# Patient Record
Sex: Female | Born: 1985 | Race: White | Hispanic: No | Marital: Single | Smoking: Never smoker
Health system: Southern US, Community
[De-identification: ages and names within clinical notes are randomized; demographics above are authoritative.]

## PROBLEM LIST (undated history)

## (undated) DIAGNOSIS — I82409 Acute embolism and thrombosis of unspecified deep veins of unspecified lower extremity: Secondary | ICD-10-CM

---

## 2006-11-04 ENCOUNTER — Ambulatory Visit: Payer: Self-pay | Admitting: Family Medicine

## 2006-11-04 LAB — CONVERTED CEMR LAB
EBV VCA IgG: 5.63 — ABNORMAL HIGH
EBV VCA IgM: 0.1
Eosinophils Absolute: 0.1 10*3/uL (ref 0.0–0.6)
Lymphocytes Relative: 27.8 % (ref 12.0–46.0)
MCHC: 34.3 g/dL (ref 30.0–36.0)
MCV: 87.8 fL (ref 78.0–100.0)
Monocytes Absolute: 0.6 10*3/uL (ref 0.2–0.7)
Monocytes Relative: 8 % (ref 3.0–11.0)
Neutro Abs: 4.4 10*3/uL (ref 1.4–7.7)
Platelets: 257 10*3/uL (ref 150–400)

## 2007-04-20 ENCOUNTER — Emergency Department (HOSPITAL_COMMUNITY): Admission: EM | Admit: 2007-04-20 | Discharge: 2007-04-20 | Payer: Self-pay | Admitting: Emergency Medicine

## 2007-06-12 DIAGNOSIS — R51 Headache: Secondary | ICD-10-CM | POA: Insufficient documentation

## 2007-06-12 DIAGNOSIS — R519 Headache, unspecified: Secondary | ICD-10-CM | POA: Insufficient documentation

## 2007-09-21 ENCOUNTER — Emergency Department (HOSPITAL_COMMUNITY): Admission: EM | Admit: 2007-09-21 | Discharge: 2007-09-22 | Payer: Self-pay | Admitting: Emergency Medicine

## 2007-10-03 ENCOUNTER — Ambulatory Visit: Payer: Self-pay | Admitting: Family Medicine

## 2011-01-15 NOTE — Assessment & Plan Note (Signed)
[facility] OFFICE NOTE   Sarah Elliott, Sarah Elliott                  MRN:          [id number]  DATE:11/04/2006                            DOB:          Jun 19, 1986    This is a 25 year old woman, here to establish with our practice,  complaining of a sore throat and swollen tonsils.  About three months  ago, she began having problems with intermittent swelling of her  tonsils, a sore throat and a low-grade fever.  She denies any headache  or sinus symptoms.  There is no cough associated with it.  She takes  Tylenol and drinks fluids.  For the first week or so when it started,  she felt the worst she has felt through this episode.  Now, her recent  flare-ups have been much milder.  She went to urgent care recently and  had a negative throat culture performed.  She was put on some type of  what sounds like a cephalosporin, as well as a course of steroids, and  she felt a bit better briefly, but never felt well.  Now she is still  struggling with the same symptoms.   PAST MEDICAL HISTORY:  She has not had a primary care physician in some  time, relying instead on urgent care visits occasionally.  She is G0,  P0.  She has never had a gynecological exam.  She has never had a  surgery.  She does have migraine headaches, but they are infrequent and  managed with over-the-counter agents only.   ALLERGIES:  PENICILLIN causes a rash.   CURRENT MEDICATIONS:  None.   HABITS:  She drinks some alcohol, but does not use tobacco.   SOCIAL HISTORY:  She is single.  She works at a [employer].   FAMILY HISTORY:  Remarkable for hypertension, diabetes, breast cancer  and migraine headaches.   OBJECTIVE:  Height 5 feet 7 inches, weight 197, BP 112/76, pulse 64 and  regular, temperature 99.9 degrees.  IN GENERAL:  She is in no acute distress.  EYES:  Clear.  EARS:  Clear.  OROPHARYNX:  Shows no erythema at all.  There  is no exudate.  Her  tonsils are somewhat large, with the right side being larger than the  left.  NECK:  Supple without lymphadenopathy at all.  LUNGS:  Clear.   ASSESSMENT AND PLAN:  Probable mononucleosis.  I suggested switching  from Tylenol to Aleve.  She is to take 2 tablets twice a day until she  feels better.  She will continue with fluids.  We will draw a CBC with differential and titers for Epstein-Barr virus  today.  I told her there is no  specific treatment for this entity, although she should be feeling  better sometime in the near future.     Tera Mater. Clent Ridges, MD  Electronically Signed    SAF/MedQ  DD: 11/04/2006  DT: 11/04/2006  Job #: [id number]

## 2011-05-21 LAB — DIFFERENTIAL
Basophils Absolute: 0.1
Basophils Relative: 1
Eosinophils Absolute: 0.1
Monocytes Absolute: 0.5
Monocytes Relative: 4
Neutro Abs: 7.4
Neutrophils Relative %: 69

## 2011-05-21 LAB — CBC
Hemoglobin: 14.4
MCHC: 34.1
RBC: 4.7
RDW: 12.6

## 2011-05-21 LAB — COMPREHENSIVE METABOLIC PANEL
ALT: 13
Alkaline Phosphatase: 52
BUN: 13
Chloride: 105
Glucose, Bld: 91
Potassium: 3.5
Sodium: 138
Total Bilirubin: 0.7
Total Protein: 7.2

## 2011-05-21 LAB — URINALYSIS, ROUTINE W REFLEX MICROSCOPIC
Bilirubin Urine: NEGATIVE
Glucose, UA: NEGATIVE
Ketones, ur: NEGATIVE
Specific Gravity, Urine: 1.025
pH: 6.5

## 2011-05-21 LAB — URINE MICROSCOPIC-ADD ON

## 2018-03-06 ENCOUNTER — Emergency Department
Admission: EM | Admit: 2018-03-06 | Discharge: 2018-03-06 | Disposition: A | Discharge status: Home / Self Care | Payer: BLUE CROSS/BLUE SHIELD | Attending: Student in an Organized Health Care Education/Training Program | Admitting: Student in an Organized Health Care Education/Training Program

## 2018-03-06 ENCOUNTER — Encounter: Payer: Self-pay | Admitting: *Deleted

## 2018-03-06 ENCOUNTER — Other Ambulatory Visit: Payer: Self-pay

## 2018-03-06 ENCOUNTER — Emergency Department: Payer: BLUE CROSS/BLUE SHIELD

## 2018-03-06 DIAGNOSIS — M79661 Pain in right lower leg: Secondary | ICD-10-CM | POA: Insufficient documentation

## 2018-03-06 DIAGNOSIS — M79604 Pain in right leg: Secondary | ICD-10-CM

## 2018-03-06 DIAGNOSIS — O9989 Other specified diseases and conditions complicating pregnancy, childbirth and the puerperium: Secondary | ICD-10-CM | POA: Insufficient documentation

## 2018-03-06 DIAGNOSIS — Z3A21 21 weeks gestation of pregnancy: Secondary | ICD-10-CM

## 2018-03-06 DIAGNOSIS — Z86718 Personal history of other venous thrombosis and embolism: Secondary | ICD-10-CM | POA: Insufficient documentation

## 2018-03-06 HISTORY — DX: Acute embolism and thrombosis of unspecified deep veins of unspecified lower extremity: I82.409

## 2018-03-06 NOTE — ED Provider Notes (Signed)
[facility] Emergency Department Provider Note    First MD Initiated Contact with Patient 03/06/18 1920     (approximate)  I have reviewed the triage vital signs and the nursing notes.   HISTORY  Chief Complaint Leg Pain    HPI Sarah Elliott is a 32 y.o. 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  Did recently fly back from [place].  Has not noted any significant swelling.  She spoke with her OB/GYN who recommended she come to the ER for evaluation rule out DVT since she did have one previously.  Previous when related to birth control.  Denies any weakness.  No abdominal pain.  No chest pain or shortness of breath.  Is still feeling the baby kick.  No vaginal bleeding or pelvic discomfort.  Past Medical History:  Diagnosis Date  . DVT, lower extremity (HCC)    History reviewed. No pertinent family history. History reviewed. No pertinent surgical history. Patient Active Problem List   Diagnosis Date Noted  . HEADACHE 06/12/2007      Prior to Admission medications   Not on File    Allergies Amoxicillin and Penicillins    Social History Social History   Tobacco Use  . Smoking status: Never Smoker  . Smokeless tobacco: Never Used  Substance Use Topics  . Alcohol use: Never    Frequency: Never  . Drug use: Never    Review of Systems Patient denies headaches, rhinorrhea, blurry vision, numbness, shortness of breath, chest pain, edema, cough, abdominal pain, nausea, vomiting, diarrhea, dysuria, fevers, rashes or hallucinations unless otherwise stated above in HPI. ____________________________________________   PHYSICAL EXAM:  VITAL SIGNS: Vitals:   03/06/18 1700  BP: 124/65  Pulse: 67  Resp: 16  Temp: 98.4 F (36.9 C)  SpO2: 100%    Constitutional: Alert and oriented. Well appearing and in no acute  distress. Eyes: Conjunctivae are normal.  Head: Atraumatic. Nose: No congestion/rhinnorhea. Mouth/Throat: Mucous membranes are moist.   Neck: Painless ROM.  Cardiovascular:   Good peripheral circulation. Respiratory: Normal respiratory effort.  No retractions.  Gastrointestinal: gravid Soft and nontender.  Musculoskeletal: No lower extremity tenderness .  No joint effusions. Neurologic:  Normal speech and language. No gross focal neurologic deficits are appreciated.  Skin:  Skin is warm, dry and intact. No rash noted. Psychiatric: Mood and affect are normal. Speech and behavior are normal.  ____________________________________________   LABS (all labs ordered are listed, but only abnormal results are displayed)  No results found for this or any previous visit (from the past 24 hour(s)). ____________________________________________ ____________________________________________  RADIOLOGY  I personally reviewed all radiographic images ordered to evaluate for the above acute complaints and reviewed radiology reports and findings.  These findings were personally discussed with the patient.  Please see medical record for radiology report.  ____________________________________________   PROCEDURES  Procedure(s) performed:  Procedures    Critical Care performed: no ____________________________________________   INITIAL IMPRESSION / ASSESSMENT AND PLAN / ED COURSE  Pertinent labs & imaging results that were available during my care of the patient were reviewed by me and considered in my medical decision making (see chart for details).  DDX: DVT, edema, cellulitis Calynn Ferrero is a 32 y.o. who presents to the ED with symptoms as described above.  Ultrasound shows no evidence of DVT.  Patient well-appearing and otherwise asymptomatic.  No signs of infectious process.  Discussed follow-up with PCP.  Have discussed with the patient and available family all diagnostics and  treatments performed thus far and all questions were answered to the best of my ability. The patient demonstrates understanding and agreement with plan.       ____________________________________________   FINAL CLINICAL IMPRESSION(S) / ED DIAGNOSES  Final diagnoses:  Right leg pain  [redacted] weeks gestation of pregnancy      NEW MEDICATIONS STARTED DURING THIS VISIT:  New Prescriptions   No medications on file     Note:  This document was prepared using Dragon voice recognition software and may include unintentional dictation errors.     Willy Eddyobinson, Arieona Swaggerty, MD 03/06/18 [phone]

## 2018-03-06 NOTE — ED Triage Notes (Addendum)
PT to ED reporting right leg pain. Pt is currently on 40 mg Lovenox daily due to pregnancy and hx of DVT. No color change pedal pulse intact and equal bilaterally. No numbness reported. No pain behind the right knee. Slight pain increased when RN pushed pts right foot back. [place] from [place] and flew here.

## 2018-03-06 NOTE — Discharge Instructions (Signed)
Follow-up with your OB/GYN and primary.  Return for worsening symptoms including worsening painful swelling, any shortness of breath or chest pain or for any additional questions or concerns.

## 2018-08-15 IMAGING — US US EXTREM LOW VENOUS*R*
1 series · 13 of 24 positions shown · non-contrast
Comparison: None

CLINICAL DATA: RIGHT leg pain



[Series 1: us extrem low venous*right* · 0.07mm/px · 13 of 35 slices shown]
[im 1/35]
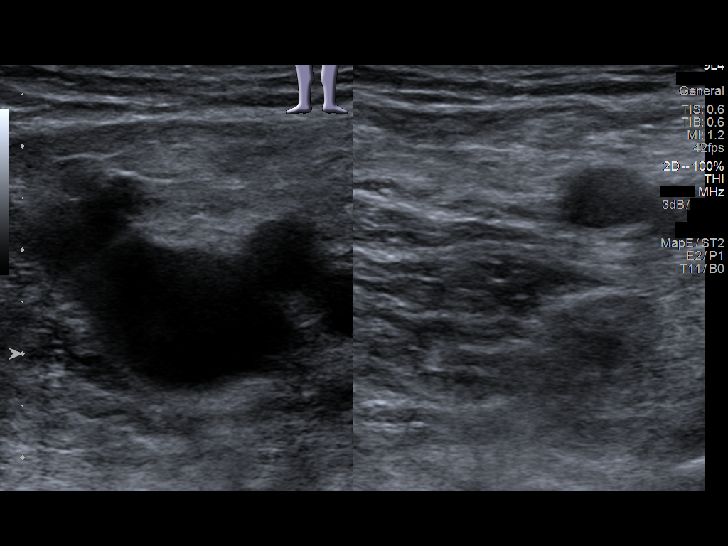
[im 3/35]
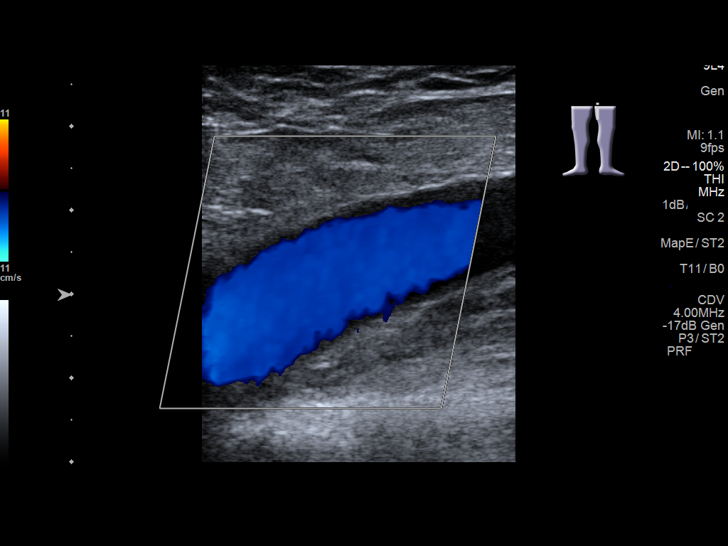
[im 6/35]
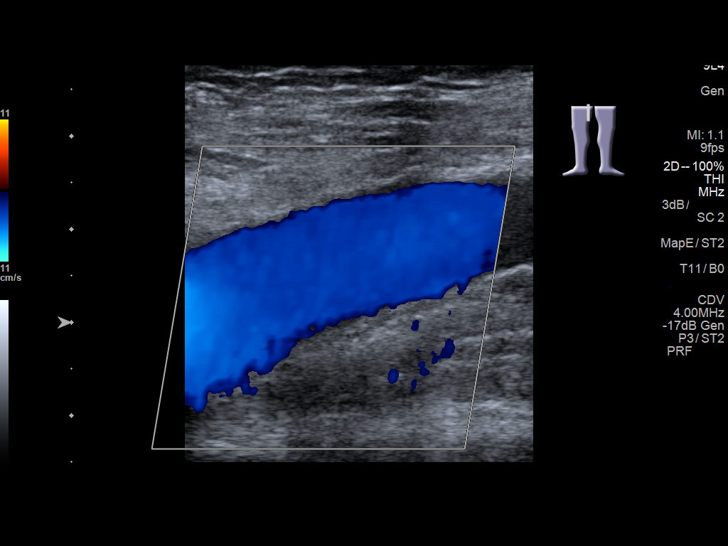
[im 9/35]
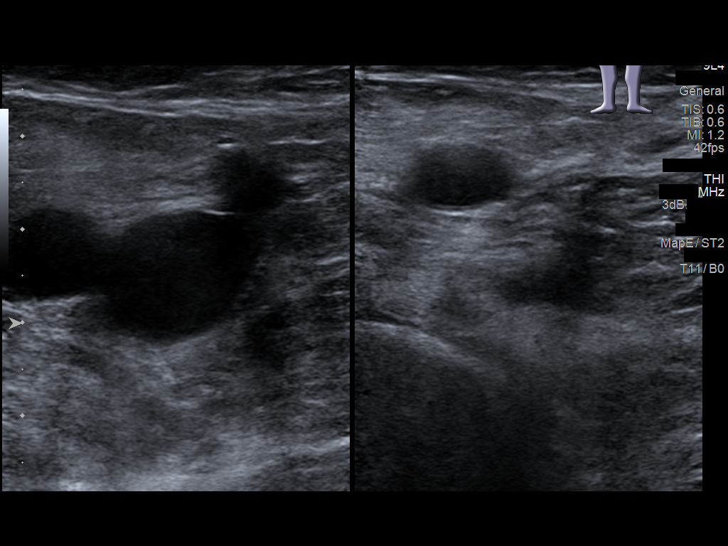
[im 12/35]
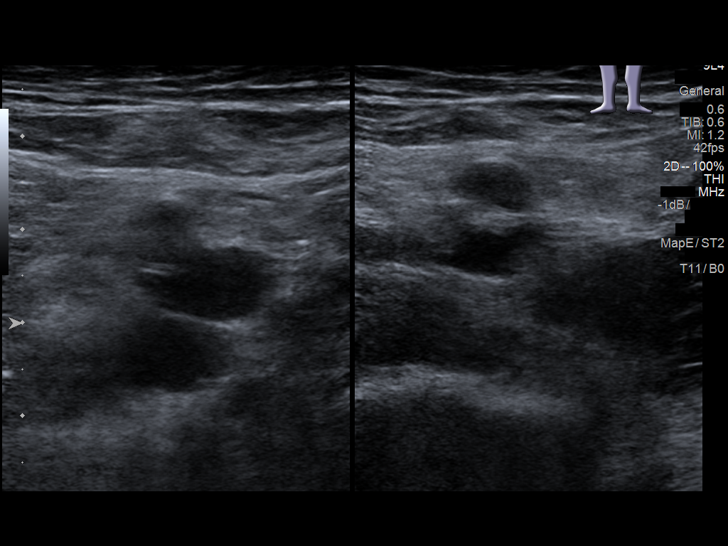
[im 15/35]
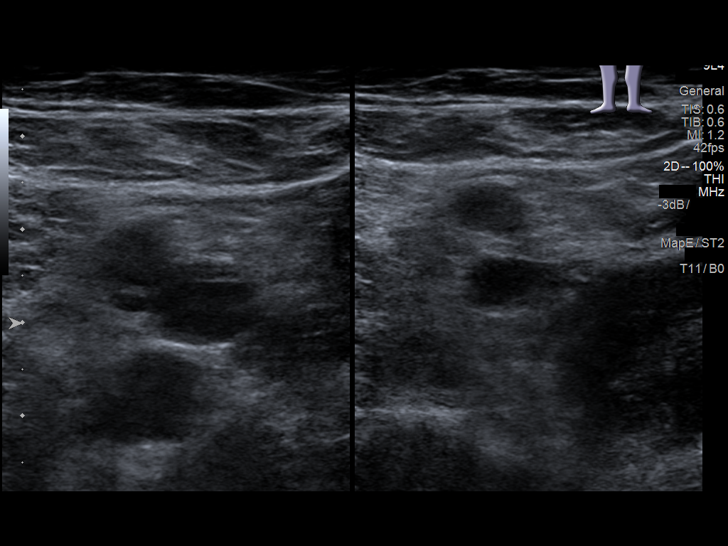
[im 18/35]
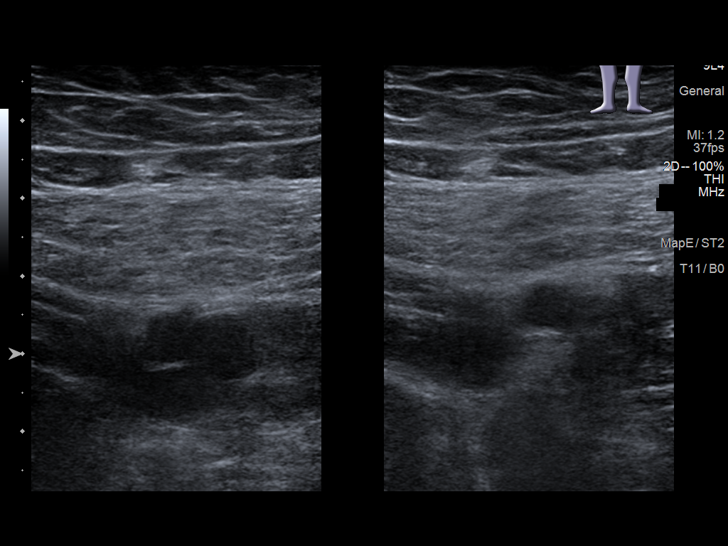
[im 20/35]
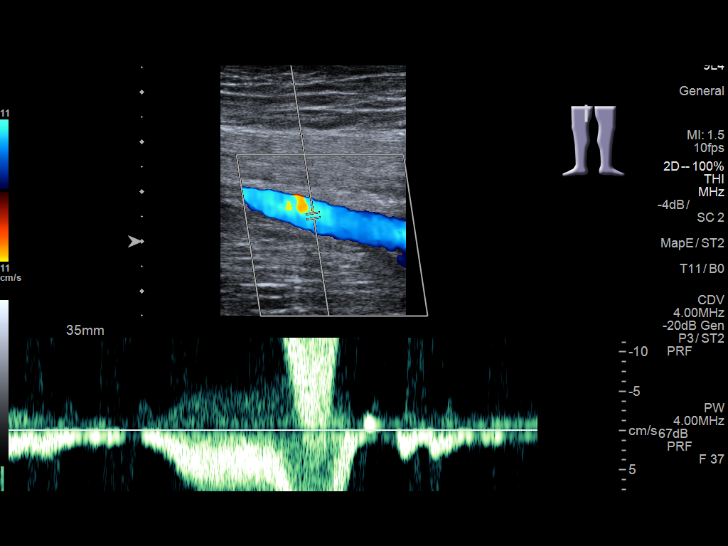
[im 23/35]
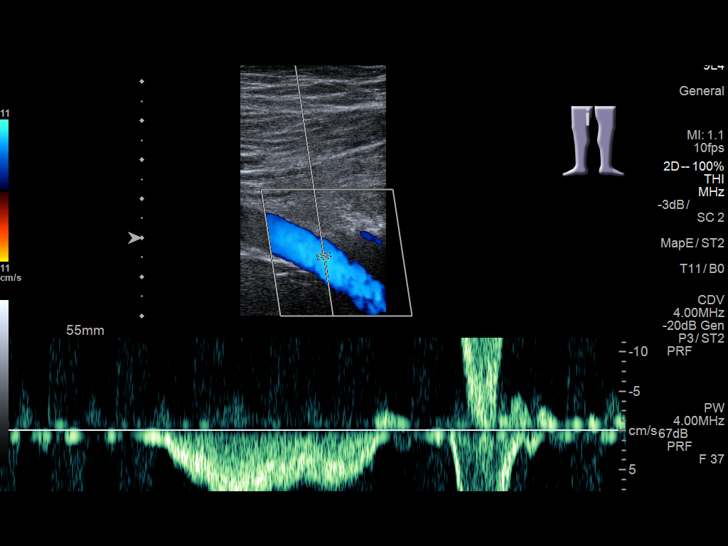
[im 26/35]
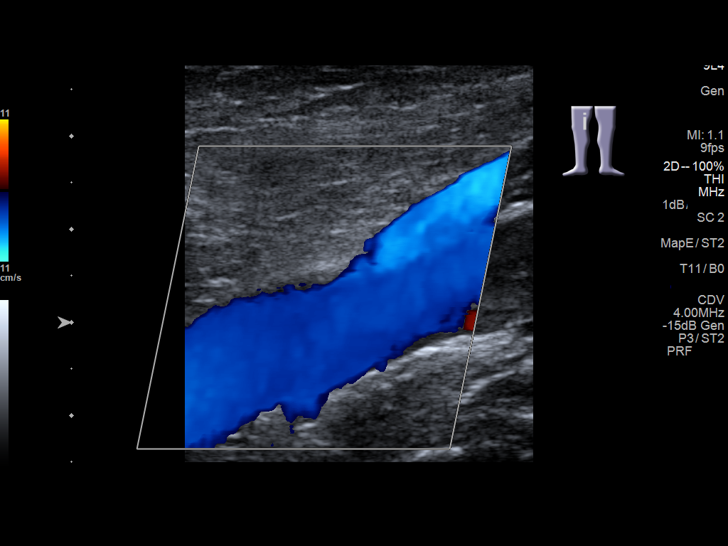
[im 29/35]
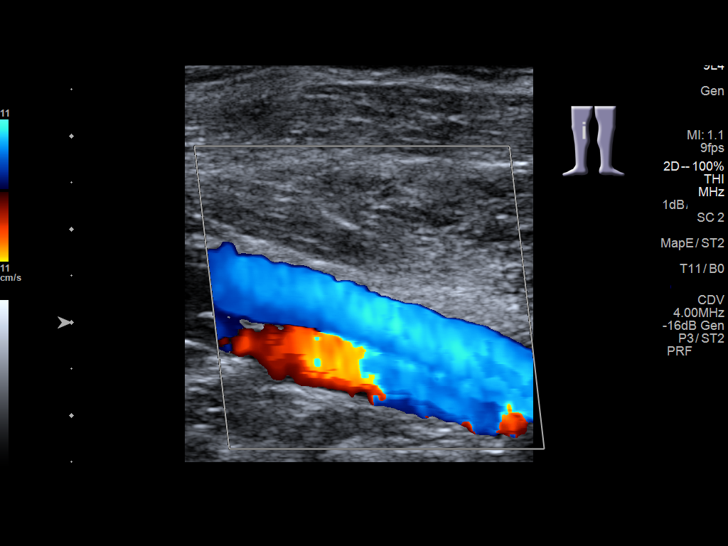
[im 32/35]
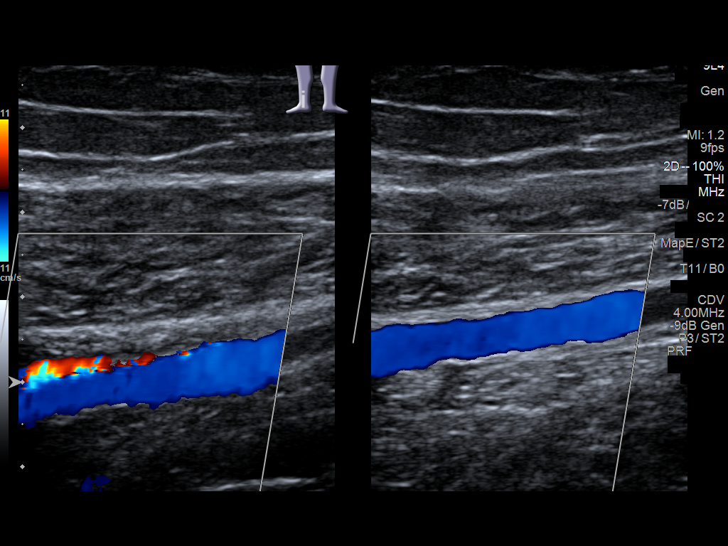
[im 35/35]
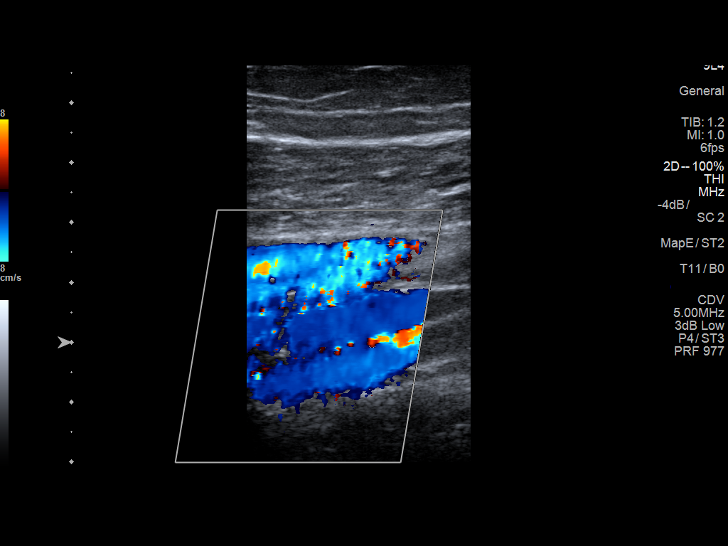

[13 of 24 positions shown; findings below may reference images not displayed]

FINDINGS: Contralateral Common Femoral Vein: Respiratory phasicity is normal
and symmetric with the symptomatic side. No evidence of thrombus.
Normal compressibility.

Common Femoral Vein: No evidence of thrombus. Normal
compressibility, respiratory phasicity and response to augmentation.

Saphenofemoral Junction: No evidence of thrombus. Normal
compressibility and flow on color Doppler imaging.

Profunda Femoral Vein: No evidence of thrombus. Normal
compressibility and flow on color Doppler imaging.

Femoral Vein: No evidence of thrombus. Normal compressibility,
respiratory phasicity and response to augmentation.

Popliteal Vein: No evidence of thrombus. Normal compressibility,
respiratory phasicity and response to augmentation.

Calf Veins: No evidence of thrombus. Normal compressibility and flow
on color Doppler imaging.

Superficial Great Saphenous Vein: No evidence of thrombus. Normal
compressibility.

Venous Reflux:  None.

Other Findings: Incidentally noted rouleaux flow at the RIGHT
popliteal vein extending into femoral vein
IMPRESSION: No evidence of deep venous thrombosis in the RIGHT lower extremity.

Rouleaux flow in RIGHT popliteal and femoral veins, nonspecific, can
be seen with slow venous flow, elevated RIGHT heart pressure,
proximal venous obstruction or thrombosis, and certain systemic
processes that lead to red cell aggregation.
